# Patient Record
Sex: Female | Born: 1955 | Race: White | Hispanic: No | Marital: Married | Smoking: Never smoker
Health system: Southern US, Community
[De-identification: ages and names within clinical notes are randomized; demographics above are authoritative.]

## PROBLEM LIST (undated history)

## (undated) DIAGNOSIS — K603 Anal fistula, unspecified: Secondary | ICD-10-CM

## (undated) DIAGNOSIS — M8580 Other specified disorders of bone density and structure, unspecified site: Secondary | ICD-10-CM

## (undated) DIAGNOSIS — N390 Urinary tract infection, site not specified: Secondary | ICD-10-CM

## (undated) DIAGNOSIS — E785 Hyperlipidemia, unspecified: Secondary | ICD-10-CM

## (undated) DIAGNOSIS — K635 Polyp of colon: Secondary | ICD-10-CM

## (undated) HISTORY — DX: Other specified disorders of bone density and structure, unspecified site: M85.80

## (undated) HISTORY — DX: Polyp of colon: K63.5

## (undated) HISTORY — DX: Anal fistula: K60.3

## (undated) HISTORY — DX: Urinary tract infection, site not specified: N39.0

## (undated) HISTORY — PX: COLONOSCOPY: SHX174

## (undated) HISTORY — DX: Anal fistula, unspecified: K60.30

## (undated) HISTORY — DX: Hyperlipidemia, unspecified: E78.5

## (undated) HISTORY — PX: TUBAL LIGATION: SHX77

## (undated) HISTORY — PX: TREATMENT FISTULA ANAL: SUR1390

---

## 1999-08-18 ENCOUNTER — Other Ambulatory Visit: Admission: RE | Admit: 1999-08-18 | Discharge: 1999-08-18 | Payer: Self-pay | Admitting: Obstetrics & Gynecology

## 2000-10-31 ENCOUNTER — Other Ambulatory Visit: Admission: RE | Admit: 2000-10-31 | Discharge: 2000-10-31 | Payer: Self-pay | Admitting: Obstetrics & Gynecology

## 2002-07-02 ENCOUNTER — Other Ambulatory Visit: Admission: RE | Admit: 2002-07-02 | Discharge: 2002-07-02 | Payer: Self-pay | Admitting: Obstetrics & Gynecology

## 2005-06-09 ENCOUNTER — Other Ambulatory Visit: Admission: RE | Admit: 2005-06-09 | Discharge: 2005-06-09 | Payer: Self-pay | Admitting: Obstetrics & Gynecology

## 2006-07-06 ENCOUNTER — Ambulatory Visit: Payer: Self-pay | Admitting: Internal Medicine

## 2006-07-20 ENCOUNTER — Ambulatory Visit: Payer: Self-pay | Admitting: Internal Medicine

## 2012-05-30 ENCOUNTER — Ambulatory Visit (INDEPENDENT_AMBULATORY_CARE_PROVIDER_SITE_OTHER): Payer: 59 | Admitting: Sports Medicine

## 2012-05-30 ENCOUNTER — Encounter: Payer: Self-pay | Admitting: Sports Medicine

## 2012-05-30 VITALS — BP 157/87 | HR 55 | Ht 67.0 in | Wt 170.0 lb

## 2012-05-30 DIAGNOSIS — M25569 Pain in unspecified knee: Secondary | ICD-10-CM | POA: Insufficient documentation

## 2012-05-30 DIAGNOSIS — S76319A Strain of muscle, fascia and tendon of the posterior muscle group at thigh level, unspecified thigh, initial encounter: Secondary | ICD-10-CM

## 2012-05-30 DIAGNOSIS — IMO0002 Reserved for concepts with insufficient information to code with codable children: Secondary | ICD-10-CM

## 2012-05-30 MED ORDER — MELOXICAM 15 MG PO TABS: 15.0000 mg | ORAL_TABLET | Freq: Every day | ORAL | Status: AC

## 2012-05-30 NOTE — Progress Notes (Signed)
  Subjective:    Patient ID: Jacqueline Flynn, female    DOB: March 24, 1956, 56 y.o.   MRN: [id number]  HPI chief complaint: Right knee pain  Very pleasant 56 year old female comes in today complaining of 3 weeks of right knee pain. She is an avid walker walking about 10 miles a week but back in April she started a running program. She is now running 9-10 miles per week. She is complaining of pain in the posterior aspect of her knee which is worse after running. Very little discomfort while running. She describes it as aching in quality. No swelling. No mechanical symptoms. No feelings of instability. It is especially noticeable when first walking after sitting for period of time. No problems with his knee in the past. No prior knee surgeries. No associated numbness or tingling. No recent trauma. She does state that she has had her current running shoes for about a year and she is wondering whether or not she should get new shoes. She takes 400-600 mg of over-the-counter ibuprofen as needed and does find that helpful.  Medications include turmeric and calcium No prior surgeries No known drug allergies Socially she doesn't smoke, denies alcohol use, and works for [employer]    Review of Systems     Objective:   Physical Exam Well-developed, well-nourished. No acute distress. Awake alert and oriented x3  Right knee: Full range of motion without effusion. She is tender to palpation along the medial distal hamstring tendons at the popliteal fossa. No tenderness over the pes anserine bursa. No palpable Baker's cyst. Slight tenderness to palpation along the medial joint line posteriorly but negative McMurray's. No tenderness along the lateral joint line. Knee is stable to valgus and varus stressing. Negative anterior drawer, negative posterior drawer. Mild patellofemoral crepitus with active extension but no pain with patellar compression. Neurovascularly intact distally.  Examination  of her feet in the standing position shows a rather neutral foot. Evaluation of her gait does show some collapse of the midfoot on the right with walking. No obvious limp.       Assessment & Plan:  1. Right knee pain secondary to distal hamstring tendinitis  Mobic 15 mg daily with food for 7 days. Body helix knee sleeve to wear with activity. Green sports insoles to help provide a little bit of an arch. I also think the patient should buy new running shoes. She has well over 500 miles on her current pair. Followup in 3 weeks. She can continue with activity as tolerated in the interim.

## 2012-06-19 ENCOUNTER — Ambulatory Visit (INDEPENDENT_AMBULATORY_CARE_PROVIDER_SITE_OTHER): Payer: 59 | Admitting: Sports Medicine

## 2012-06-19 VITALS — BP 110/64 | Ht 67.0 in | Wt 165.0 lb

## 2012-06-19 DIAGNOSIS — M25569 Pain in unspecified knee: Secondary | ICD-10-CM

## 2012-06-19 NOTE — Assessment & Plan Note (Signed)
Significantly improved following Mobic and icing. Will advance to eccentric strengthening exercises now that acute reaction has improved.  Given eccentric hamstring strengthening exercises and hip Abductor exercises Continue with sports insoles. Continue mobic prn with regular post exercises icing Follow up prn

## 2012-06-19 NOTE — Progress Notes (Signed)
  [facility]  Patient name: Jacqueline Flynn MRN [id number]  Date of birth: 10-28-1955  CC & HPI:  Jacqueline Flynn is a 56 y.o. female presenting today for follow up of R posterior knee pain dx as semitendinous tendonitis.  She reports significant improvement in pain with complete resolution of her pain following 7 day course of Mobic.  Reports mild return of symptoms following tapering of mobic and is now having 1/10 pain especially when walking up steps.  ROS:  No knee clicking/locking, no falls  Pertinent History Reviewed:  Social History: Reviewed - Significant for nonsmoker  Objective Findings:  Vitals:  Filed Vitals:   06/19/12 1158  BP: 110/64    PE: GENERAL:  Adult caucasian female. In no discomfort; no respiratory distress. KNEE: R knee, TTP over insertion of semitendinosus.  Full ROM, no joint effusion, no midline tenderness, no tenderness over pes bursa. HIP STRENGTH: Abduction = 4/5 B Knee Flexion strength: 4/5 @ 90 & 60o bilateral with 4-/5 on L @ 90    Assessment & Plan:

## 2012-06-20 ENCOUNTER — Ambulatory Visit: Payer: 59 | Admitting: Sports Medicine

## 2014-10-08 ENCOUNTER — Ambulatory Visit: Payer: 59 | Admitting: Sports Medicine

## 2014-10-13 ENCOUNTER — Ambulatory Visit (INDEPENDENT_AMBULATORY_CARE_PROVIDER_SITE_OTHER): Payer: 59 | Admitting: Sports Medicine

## 2014-10-13 ENCOUNTER — Encounter: Payer: Self-pay | Admitting: Sports Medicine

## 2014-10-13 VITALS — BP 117/62 | Ht 66.0 in | Wt 160.0 lb

## 2014-10-13 DIAGNOSIS — M25551 Pain in right hip: Secondary | ICD-10-CM

## 2014-10-13 DIAGNOSIS — M25561 Pain in right knee: Secondary | ICD-10-CM

## 2014-10-13 MED ORDER — MELOXICAM 15 MG PO TABS: ORAL_TABLET | ORAL | Status: DC

## 2014-10-13 NOTE — Progress Notes (Signed)
   Subjective:    Patient ID: Jacqueline Flynn, female    DOB: 1956/05/05, 59 y.o.   MRN: [id number]  HPI chief complaint: Right hip and right leg pain  Very pleasant 59 year old female comes in today complaining of several weeks of right hip and leg pain. She is an avid Education officer, environmental. She began to experience some pain in the proximal hamstring area without any known injury. Pain at times will radiate past the knee into the lateral lower leg. She was also getting some pain along the lateral foot but that has resolved. Pain is now primarily in the proximal hamstring area. Present mainly with lumbar flexion. Otherwise, minimal pain. No pain with sitting. No deep-seated groin pain. No associated numbness or tingling. She was seen in the office in 2013 and diagnosed with distal hamstring tendinitis. She was given a body helix sleeve, green sports insoles, a home exercise program, and a seven-day course of meloxicam. Her symptoms resolved with this treatment.    Review of Systems     Objective:   Physical Exam Well-developed, well-nourished. No acute distress. Sitting comfortably in exam room.  Right hip: Smooth painless hip range of motion with a negative logroll. There is tenderness to palpation at the origin of the hamstring tendon. No tenderness over the greater trochanteric bursa.  Neurological exam: Negative straight leg raise on the left. Straight leg raise on the right reproduces posterior right hip pain that no true radiculopathy. Reflexes are equal at the Achilles and patellar tendons bilaterally. Strength is 5/5 both lower extremities. Sensation is grossly intact to light-touch.       Assessment & Plan:  Right hip pain likely secondary to proximal hamstring tendinopathy  [place] home exercise program. Meloxicam 15 mg daily for 7 days then when necessary. I provided her with a new body helix knee sleeve (she still uses the old one with exercise) and a new pair of green sports  insoles. Although I think this is likely a proximal hamstring tendinopathy the possibility of lumbar radiculopathy exists and if symptoms persist or worsen despite today's treatment patient will return to the office for reevaluation and further workup. Otherwise, continue with activity as tolerated and follow-up when necessary.

## 2015-06-08 ENCOUNTER — Encounter: Payer: Self-pay | Admitting: Sports Medicine

## 2015-06-08 ENCOUNTER — Ambulatory Visit (INDEPENDENT_AMBULATORY_CARE_PROVIDER_SITE_OTHER): Payer: 59 | Admitting: Sports Medicine

## 2015-06-08 VITALS — BP 124/77 | HR 74 | Ht 67.0 in | Wt 153.0 lb

## 2015-06-08 DIAGNOSIS — M25572 Pain in left ankle and joints of left foot: Secondary | ICD-10-CM | POA: Diagnosis not present

## 2015-06-08 NOTE — Progress Notes (Signed)
Patient ID: Jacqueline Flynn, female   DOB: December 20, 1955, 59 y.o.   MRN: [id number]  CC: Left foot pain  HPI:  59 year old runner/walker c/o left foot pain that began around 3 months ago.  States that a few weeks before that she got some new shoes ([employer]).  The pain is on the medial aspect of the left MTP joint.  She also has some swelling of that area. Worse when she runs or walks (runs about 9 miles/week, walked 2.5 miles 4-5x weekly) Notes that last week it was more swollen and red; she rested for 3 days and it has improved. Has not tried ice, NSAIDS, or anything else.  Notes that she has been wearing her formerly prescribed green inserts.   EXAM:  BP 124/77 mmHg  Pulse 74  Ht 5\' 7"  (1.702 m)  Wt 153 lb (69.4 kg)  BMI 23.96 kg/m2 Well-developed, well-nourished. No acute distress. Sitting comfortably in exam room. Breathing comfortably, appears well perfused with warm pink skin.   Foot: Left foot with prominence over medial MTP joint. TTP over this area; no ttp over any other area on foot or ankle. No redness or warmth. Minimal lateral angulation of great toe.  Pain with movement of great toe. Normal ROM of ankle and foot; normal strength and sensation.   [place]: Hypoechoic area extending from left great toe MTP, thickened bursa visualized as well.   A/P:  60 year old runner/walker c/o left foot pain that began about 3 months ago, around the time she got some new shoes. On exam and [place], it appears that she has an inflamed bursa in this area, as well as fluid extending from the MTP joint that would suggest inflammation of this joint. Her shoes appear to have a narrow toe box, which could be contributing to this.  Plan:  Recommended getting new shoes with wider toe box to reduce friction over the bursae and compression of this joint. Placed first ray post on her existing green inserts to help stabilize the joint.  Recommend aspercream and ice TID to reduce inflammation.  Continue activity  as tolerated. Gave return precautions for worsening symptoms.   Waynard Reeds, MD This pt seen and discussed with Shellia Cleverly  Patient seen and evaluated with the above-named resident. I agree with the plan of care. Hopefully the addition of a first ray post to her green sports insole in a wider toe box will help reduce her symptoms. Follow-up if symptoms worsen.

## 2015-10-06 ENCOUNTER — Ambulatory Visit: Payer: 59 | Admitting: Podiatry

## 2015-10-13 ENCOUNTER — Ambulatory Visit: Payer: 59 | Admitting: Podiatry

## 2015-10-27 ENCOUNTER — Ambulatory Visit (INDEPENDENT_AMBULATORY_CARE_PROVIDER_SITE_OTHER): Payer: 59 | Admitting: Podiatry

## 2015-10-27 ENCOUNTER — Encounter: Payer: Self-pay | Admitting: Podiatry

## 2015-10-27 ENCOUNTER — Ambulatory Visit (INDEPENDENT_AMBULATORY_CARE_PROVIDER_SITE_OTHER): Payer: 59

## 2015-10-27 VITALS — BP 126/77 | HR 60 | Resp 12

## 2015-10-27 DIAGNOSIS — M779 Enthesopathy, unspecified: Secondary | ICD-10-CM | POA: Diagnosis not present

## 2015-10-27 DIAGNOSIS — M2012 Hallux valgus (acquired), left foot: Secondary | ICD-10-CM | POA: Diagnosis not present

## 2015-10-27 NOTE — Progress Notes (Signed)
   Subjective:    Patient ID: Jacqueline Flynn, female    DOB: [birth year], 60 y.o.   MRN: [id number]  HPI: She presents today with a chief complaint of a painful bunion left foot 10 months. She states that she is an avid runner and has been reduced to walking at this point. She piercings that bother her foot bending the toe is painful particularly on end range. She states that the ball seems to cause pain also particularly with 1 particular pair of sandals.    Review of Systems  All other systems reviewed and are negative.      Objective:   Physical Exam: Vital signs are stable she is alert and oriented 3. Pulses are strongly palpable. Neurologic sensorium is intact. Deep tendon reflexes are intact. Muscle strength +5 over 5 dorsiflexion plantar flexors and inverters everters all intrinsic musculature is intact. Orthopedic evaluation demonstrates hallux abductovalgus deformity of the left foot than minimal she does have pain on end range of motion of the first metatarsophalangeal joint of the left foot which is significantly less dorsiflexion than that of the right foot. Radiographic evaluation today does demonstrate an elongated elevated first metatarsal of the left foot resulting in hallux limitus of the first metatarsophalangeal joint early dorsal spurring is noted and early joint space narrowing. 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 Cutaneous evaluation demonstrates supple well-hydrated cutis no erythema or edema cellulitis drainage or odor.        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  Plan: Discussed etiology pathology conservative versus surgical therapies. After thorough discussion of surgical and nonsurgical and other conservative means we decided to inject with dexamethasone first metatarsophalangeal joint of the left foot after sterile Betadine skin prep. 2 mg was  injected. I will follow-up with her on an as-needed basis for evaluation. We did discuss the possible need for long-term oral therapy orthotics and/or surgery.

## 2015-12-16 ENCOUNTER — Other Ambulatory Visit: Payer: Self-pay | Admitting: Obstetrics & Gynecology

## 2015-12-16 DIAGNOSIS — R928 Other abnormal and inconclusive findings on diagnostic imaging of breast: Secondary | ICD-10-CM

## 2015-12-23 ENCOUNTER — Ambulatory Visit
Admission: RE | Admit: 2015-12-23 | Discharge: 2015-12-23 | Disposition: A | Discharge status: Home / Self Care | Payer: 59 | Source: Ambulatory Visit | Attending: Obstetrics & Gynecology | Admitting: Obstetrics & Gynecology

## 2015-12-23 ENCOUNTER — Other Ambulatory Visit: Payer: 59

## 2015-12-23 DIAGNOSIS — R928 Other abnormal and inconclusive findings on diagnostic imaging of breast: Secondary | ICD-10-CM

## 2016-05-16 ENCOUNTER — Encounter: Payer: Self-pay | Admitting: Gastroenterology

## 2016-05-30 ENCOUNTER — Encounter: Payer: Self-pay | Admitting: Gastroenterology

## 2016-06-21 ENCOUNTER — Other Ambulatory Visit: Payer: Self-pay | Admitting: Emergency Medicine

## 2016-06-21 ENCOUNTER — Ambulatory Visit
Admission: RE | Admit: 2016-06-21 | Discharge: 2016-06-21 | Disposition: A | Discharge status: Home / Self Care | Payer: No Typology Code available for payment source | Source: Ambulatory Visit | Attending: Emergency Medicine | Admitting: Emergency Medicine

## 2016-06-21 DIAGNOSIS — T1490XA Injury, unspecified, initial encounter: Secondary | ICD-10-CM

## 2016-07-18 ENCOUNTER — Ambulatory Visit (AMBULATORY_SURGERY_CENTER): Payer: 59 | Admitting: *Deleted

## 2016-07-18 VITALS — Ht 67.0 in | Wt 165.0 lb

## 2016-07-18 DIAGNOSIS — Z1211 Encounter for screening for malignant neoplasm of colon: Secondary | ICD-10-CM

## 2016-07-18 MED ORDER — NA SULFATE-K SULFATE-MG SULF 17.5-3.13-1.6 GM/177ML PO SOLN: 1.0000 | Freq: Once | ORAL | 0 refills | Status: AC

## 2016-07-18 NOTE — Progress Notes (Signed)
No egg or soy allergy. No anesthesia problems.  No home O2.  No diet meds.  

## 2016-08-02 ENCOUNTER — Encounter: Payer: 59 | Admitting: Gastroenterology

## 2016-08-05 ENCOUNTER — Encounter: Payer: Self-pay | Admitting: Gastroenterology

## 2016-08-16 ENCOUNTER — Encounter: Payer: Self-pay | Admitting: Gastroenterology

## 2016-08-16 ENCOUNTER — Ambulatory Visit (AMBULATORY_SURGERY_CENTER): Payer: 59 | Admitting: Gastroenterology

## 2016-08-16 VITALS — BP 136/72 | HR 68 | Temp 98.4°F | Resp 15 | Ht 67.0 in | Wt 165.0 lb

## 2016-08-16 DIAGNOSIS — Z1211 Encounter for screening for malignant neoplasm of colon: Secondary | ICD-10-CM

## 2016-08-16 DIAGNOSIS — Z1212 Encounter for screening for malignant neoplasm of rectum: Secondary | ICD-10-CM | POA: Diagnosis not present

## 2016-08-16 DIAGNOSIS — D122 Benign neoplasm of ascending colon: Secondary | ICD-10-CM

## 2016-08-16 MED ORDER — SODIUM CHLORIDE 0.9 % IV SOLN: 500.0000 mL | INTRAVENOUS | Status: DC

## 2016-08-16 NOTE — Patient Instructions (Signed)
YOU HAD AN ENDOSCOPIC PROCEDURE TODAY AT [facility]:   Refer to the procedure report that was given to you for any specific questions about what was found during the examination.  If the procedure report does not answer your questions, please call your gastroenterologist to clarify.  If you requested that your care partner not be given the details of your procedure findings, then the procedure report has been included in a sealed envelope for you to review at your convenience later.  YOU SHOULD EXPECT: Some feelings of bloating in the abdomen. Passage of more gas than usual.  Walking can help get rid of the air that was put into your GI tract during the procedure and reduce the bloating. If you had a lower endoscopy (such as a colonoscopy or flexible sigmoidoscopy) you may notice spotting of blood in your stool or on the toilet paper. If you underwent a bowel prep for your procedure, you may not have a normal bowel movement for a few days.  Please Note:  You might notice some irritation and congestion in your nose or some drainage.  This is from the oxygen used during your procedure.  There is no need for concern and it should clear up in a day or so.  SYMPTOMS TO REPORT IMMEDIATELY:   Following lower endoscopy (colonoscopy or flexible sigmoidoscopy):  Excessive amounts of blood in the stool  Significant tenderness or worsening of abdominal pains  Swelling of the abdomen that is new, acute  Fever of 100F or higher   For urgent or emergent issues, a gastroenterologist can be reached at any hour by calling [phone].   DIET:  We do recommend a small meal at first, but then you may proceed to your regular diet.  Drink plenty of fluids but you should avoid alcoholic beverages for 24 hours.  ACTIVITY:  You should plan to take it easy for the rest of today and you should NOT DRIVE or use heavy machinery until tomorrow (because of the sedation medicines used during the test).     FOLLOW UP: Our staff will call the number listed on your records the next business day following your procedure to check on you and address any questions or concerns that you may have regarding the information given to you following your procedure. If we do not reach you, we will leave a message.  However, if you are feeling well and you are not experiencing any problems, there is no need to return our call.  We will assume that you have returned to your regular daily activities without incident.  If any biopsies were taken you will be contacted by phone or by letter within the next 1-3 weeks.  Please call us at [phone] if you have not heard about the biopsies in 3 weeks.    SIGNATURES/CONFIDENTIALITY: You and/or your care partner have signed paperwork which will be entered into your electronic medical record.  These signatures attest to the fact that that the information above on your After Visit Summary has been reviewed and is understood.  Full responsibility of the confidentiality of this discharge information lies with you and/or your care-partner.  Polyp, hemorrhoids-handouts given  Repeat colonoscopy will be determined by pathology.

## 2016-08-16 NOTE — Progress Notes (Signed)
Report given to PACU RN, vss 

## 2016-08-16 NOTE — Progress Notes (Signed)
Called to room to assist during endoscopic procedure.  Patient ID and intended procedure confirmed with present staff. Received instructions for my participation in the procedure from the performing physician.  

## 2016-08-16 NOTE — Op Note (Signed)
[facility] Patient Name: Jacqueline Flynn Procedure Date: 08/16/2016 2:05 PM MRN: [id number] Endoscopist: Mauri Pole , MD Age: 60 Referring MD:  Date of Birth: 06/11/1956 Gender: Female Account #: [id number] Procedure:                Colonoscopy Indications:              Screening for colorectal malignant neoplasm, Last                            colonoscopy: 2007 Medicines:                Monitored Anesthesia Care Procedure:                Pre-Anesthesia Assessment:                           - Prior to the procedure, a History and Physical                            was performed, and patient medications and                            allergies were reviewed. The patient's tolerance of                            previous anesthesia was also reviewed. The risks                            and benefits of the procedure and the sedation                            options and risks were discussed with the patient.                            All questions were answered, and informed consent                            was obtained. Prior Anticoagulants: The patient has                            taken no previous anticoagulant or antiplatelet                            agents. ASA Grade Assessment: II - A patient with                            mild systemic disease. After reviewing the risks                            and benefits, the patient was deemed in                            satisfactory condition to undergo the procedure.  After obtaining informed consent, the colonoscope                            was passed under direct vision. Throughout the                            procedure, the patient's blood pressure, pulse, and                            oxygen saturations were monitored continuously. The                            Model CF-HQ190L [id number]) scope was introduced                            through the anus and advanced to  the the cecum,                            identified by appendiceal orifice and ileocecal                            valve. The colonoscopy was performed without                            difficulty. The patient tolerated the procedure                            well. The quality of the bowel preparation was                            excellent. The ileocecal valve, appendiceal                            orifice, and rectum were photographed. Scope In: 2:25:11 PM Scope Out: 2:49:24 PM Scope Withdrawal Time: 0 hours 17 minutes 22 seconds  Total Procedure Duration: 0 hours 24 minutes 13 seconds  Findings:                 The perianal and digital rectal examinations were                            normal.                           A 8 mm polyp was found in the ascending colon. The                            polyp was sessile. The polyp was removed with a                            cold snare. Resection and retrieval were complete.                           Non-bleeding internal hemorrhoids were found during  retroflexion. The hemorrhoids were small.                           The exam was otherwise without abnormality. Complications:            No immediate complications. Estimated Blood Loss:     Estimated blood loss was minimal. Impression:               - One 8 mm polyp in the ascending colon, removed                            with a cold snare. Resected and retrieved.                           - Non-bleeding internal hemorrhoids.                           - The examination was otherwise normal. Recommendation:           - Patient has a contact number available for                            emergencies. The signs and symptoms of potential                            delayed complications were discussed with the                            patient. Return to normal activities tomorrow.                            Written discharge instructions were provided to the                             patient.                           - Resume previous diet.                           - Continue present medications.                           - Await pathology results.                           - Repeat colonoscopy in 5-10 years for surveillance                            based on pathology results. Mauri Pole, MD 08/16/2016 2:52:38 PM This report has been signed electronically.

## 2016-08-17 ENCOUNTER — Telehealth: Payer: Self-pay | Admitting: *Deleted

## 2016-08-17 NOTE — Telephone Encounter (Signed)
  Follow up Call-  Call back number 08/16/2016  Post procedure Call Back phone  # [phone]  Permission to leave phone message Yes  Some recent data might be hidden     Patient questions:  Do you have a fever, pain , or abdominal swelling? No. Pain Score  0 *  Have you tolerated food without any problems? Yes.    Have you been able to return to your normal activities? Yes.    Do you have any questions about your discharge instructions: Diet   No. Medications  No. Follow up visit  No.  Do you have questions or concerns about your Care? No.  Actions: * If pain score is 4 or above: No action needed, pain <4.

## 2016-08-24 ENCOUNTER — Encounter: Payer: Self-pay | Admitting: Gastroenterology

## 2017-01-18 IMAGING — CR DG FOREARM 2V*R*
2 series · 2 of 2 positions shown · non-contrast
Comparison: None.

CLINICAL DATA: Right forearm contusion 2 days ago

EXAM:
RIGHT FOREARM - 2 VIEW

[x forearm ap right]
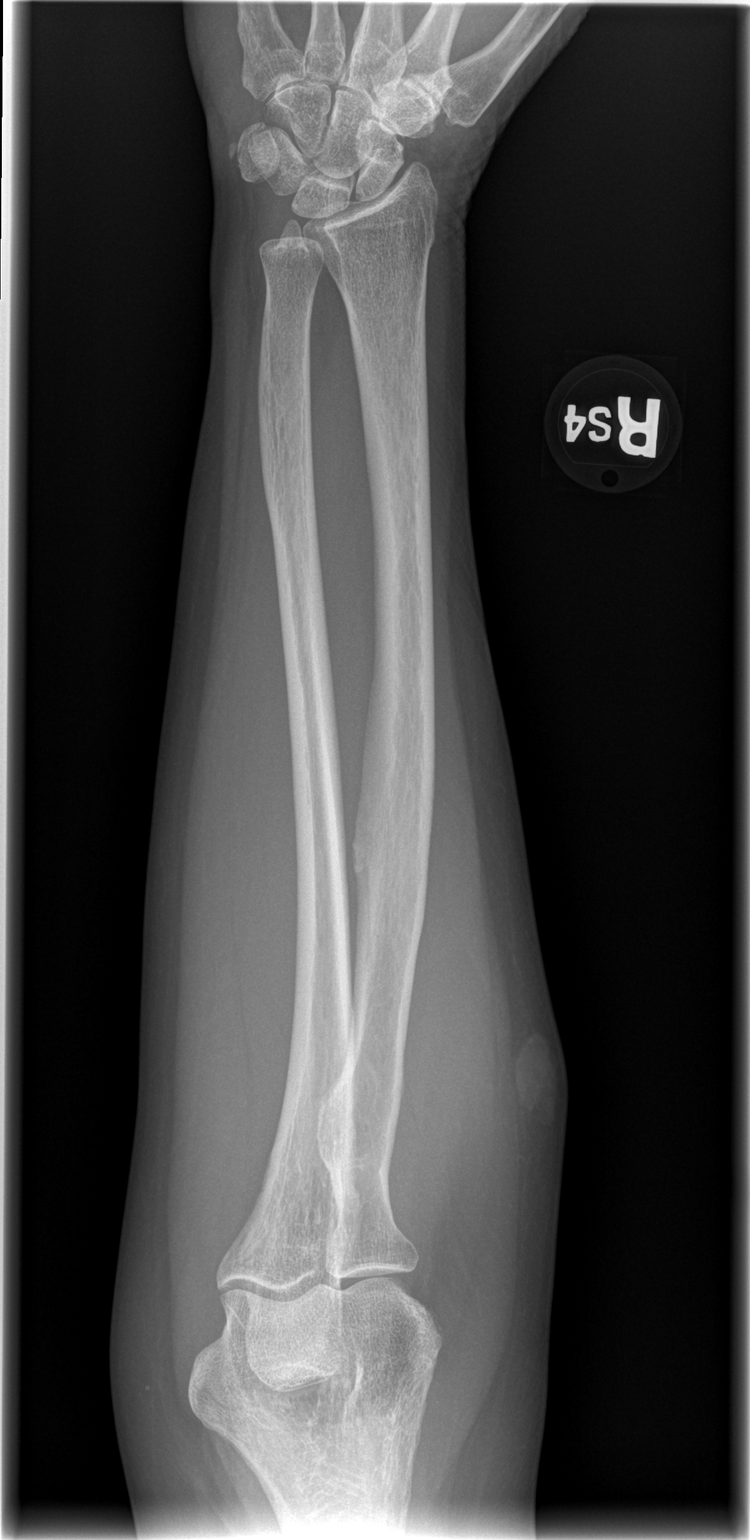

[x forearm lat right]
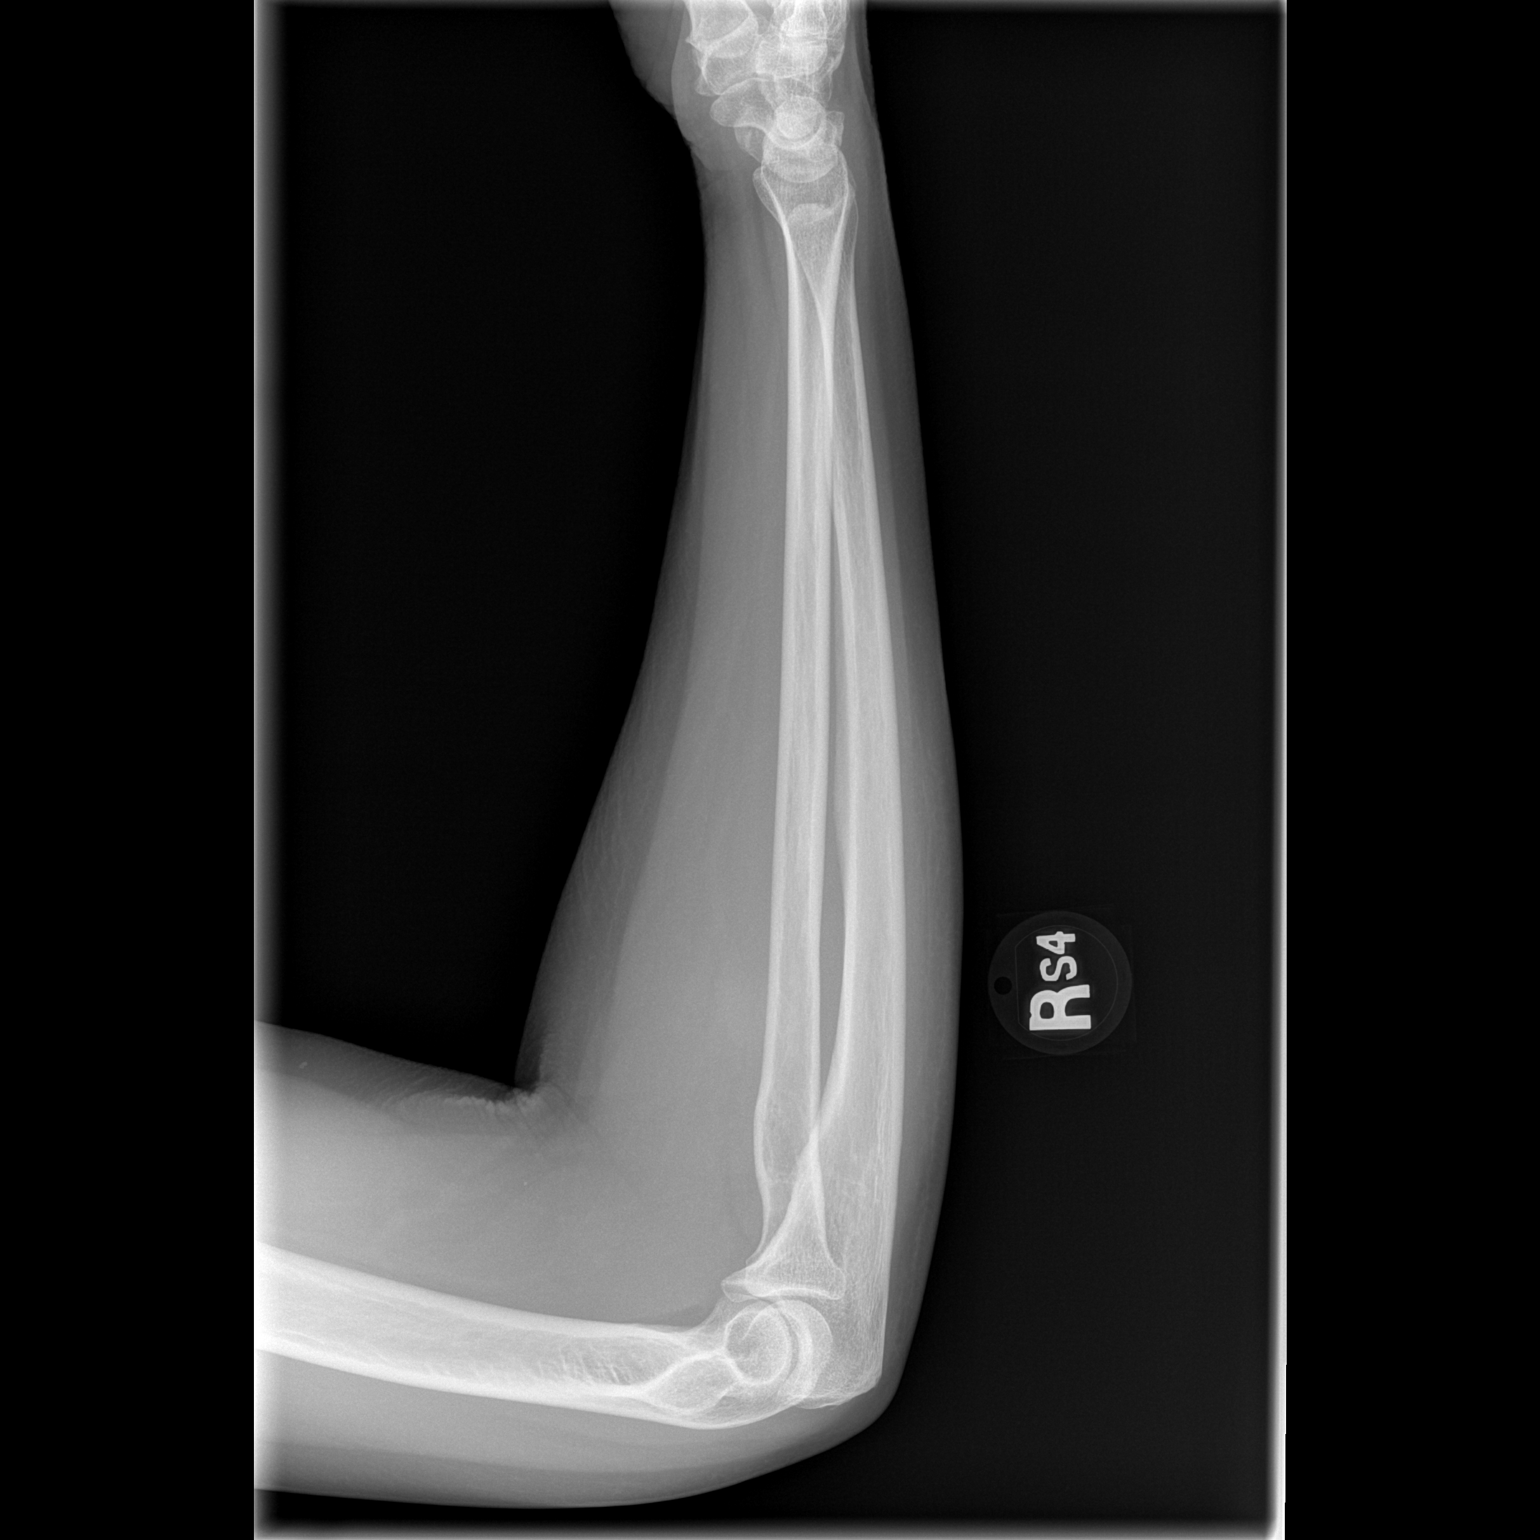

[2 of 2 positions shown; findings below may reference images not displayed]

FINDINGS: Two views of the right forearm submitted. No acute fracture or
subluxation. Soft tissue swelling noted proximal forearm.
IMPRESSION: No acute fracture or subluxation. Soft tissue swelling proximal
forearm.

## 2018-02-23 ENCOUNTER — Other Ambulatory Visit: Payer: Self-pay | Admitting: Internal Medicine

## 2018-02-23 DIAGNOSIS — E785 Hyperlipidemia, unspecified: Secondary | ICD-10-CM

## 2018-03-20 ENCOUNTER — Ambulatory Visit
Admission: RE | Admit: 2018-03-20 | Discharge: 2018-03-20 | Disposition: A | Discharge status: Home / Self Care | Payer: No Typology Code available for payment source | Source: Ambulatory Visit | Attending: Internal Medicine | Admitting: Internal Medicine

## 2018-03-20 DIAGNOSIS — E785 Hyperlipidemia, unspecified: Secondary | ICD-10-CM

## 2019-10-17 ENCOUNTER — Other Ambulatory Visit: Payer: Self-pay

## 2019-10-17 ENCOUNTER — Ambulatory Visit: Payer: BC Managed Care – PPO | Admitting: Sports Medicine

## 2019-10-17 ENCOUNTER — Ambulatory Visit
Admission: RE | Admit: 2019-10-17 | Discharge: 2019-10-17 | Disposition: A | Discharge status: Home / Self Care | Payer: BC Managed Care – PPO | Source: Ambulatory Visit | Attending: Sports Medicine | Admitting: Sports Medicine

## 2019-10-17 VITALS — BP 128/70 | Ht 67.0 in | Wt 156.0 lb

## 2019-10-17 DIAGNOSIS — R29898 Other symptoms and signs involving the musculoskeletal system: Secondary | ICD-10-CM | POA: Diagnosis not present

## 2019-10-17 DIAGNOSIS — M25561 Pain in right knee: Secondary | ICD-10-CM

## 2019-10-17 NOTE — Progress Notes (Addendum)
HPI  CC: Right hip and knee pain.  Right knee pain.:  Patient has had issues with right leg pain for multiple years and has previously seen the sports medicine clinic for issues of hamstring weakness.  Her pain had resolved until a few months ago when it started to return.  She is an avid runner, running 3 miles 3 times a week and walking 3 miles 2 other days a week.  The pain usually starts after the run, not during the run.  Specifically, after she has been sitting for a while and then gets back up.  It is a medial knee pain.  Does not extend to the shin or thigh.  No neuropathic pain, no numbness.  Describes some audible popping, but no pain with popping sensation.  No buckling of the knee.  She does state that sometimes she notices the right knee is more swollen than the left.  She has taken ibuprofen once or twice.  Has not tried anything else to improve the pain.  Right gluteal soreness: She cannot state whether this occurred before or after the right knee pain started worsening.  She is not describe it as a pain, but more of a soreness.  Also occurs after she has run.  Patient has not have any known heart or kidney issues, does not have high blood pressure.  Medications/Interventions Tried: Ibuprofen 400 mg  See HPI and/or previous note for associated ROS.  Objective: BP 128/70   Ht 5\' 7"  (1.702 m)   Wt 156 lb (70.8 kg)   BMI 24.43 kg/m  Gen:  NAD, well groomed, a/o x3, normal affect.  Appears stated age. CV: Well-perfused. Warm.  Resp: Non-labored.  Neuro: Sensation intact throughout. No gross coordination deficits. MSK: Lower extremities appear symmetrical bilaterally.  No obvious deformity seen.  Passive and active range of motion normal except for slight decrease in inward rotation of the right hip.  Slight right abductor muscle weakness.  5/5 strength otherwise in the lower extremities bilaterally.  No knee instability.  Thessaly test negative.  Trendelenburg positive. Gait:  Nonpathologic posture, unremarkable stride without signs of limp or balance issues.   Assessment and plan:  Weakness of right hip Patient complains of a soreness in her right gluteal area after running.  Trendelenburg positive on exam.  Soreness occurs after running, during.  On exam she had some right abductor muscle weakness.  Most likely gluteus medius.  Patient was given home exercises to do and advised to adjust her running schedule as needed due to the pain/weakness.  Knee pain Patient seen in the sports medicine office several years ago for right knee pain that was diagnosed as being due to hamstring weakness.  Was given exercises and then the pain improved.  Recently pain came back specifically after she runs.  Still is able to run 3 miles 3 times a week.  Tenderness palpation on the medial joint line of the knee.  Knee otherwise stable.  Negative for signs of meniscal injury.  Most likely represents arthritis of the knee. -Follow-up knee x-ray -Ibuprofen 40 mg once a day as needed. -If pain is worsening and interfering with her day-to-day functioning, patient should schedule a follow-up appointment before she leaves for [place] for possible steroid injection.  Otherwise follow-up after she returns from [place].   Orders Placed This Encounter  Procedures  . DG Knee AP/LAT W/Sunrise Right    Standing Status:   Future    Standing Expiration Date:   12/14/2020  Order Specific Question:   Reason for Exam (SYMPTOM  OR DIAGNOSIS REQUIRED)    Answer:   right knee pain; ap view to be standing please    Order Specific Question:   Preferred imaging location?    Answer:   [facility]    Order Specific Question:   Radiology Contrast Protocol - do NOT remove file path    Answer:   \\[email or web address]    No orders of the defined types were placed in this encounter.    Clemetine Marker, MD [facility] resident, PGY 2 10/17/2019 10:22  AM  Patient seen and evaluated with the resident.  I agree with the above plan of care.  For her posterior right hip pain, the patient was given both pelvic stabilizer exercises and hip abductor strengthening exercises.  Her right knee pain is likely secondary to mild osteoarthritis.  She may have a degenerative meniscal tear as well although she is able to continue to be fairly active.  We will get an x-ray of her knee and I will call her with those results when available.  I will give her isometric quad strengthening exercises and she will modify her activity based on symptoms.  If symptoms worsen then consider merits of cortisone injection.  Follow-up as needed.  Addendum: X-rays show only mild degenerative changes.  Nothing acute.

## 2019-10-17 NOTE — Patient Instructions (Signed)
It was nice to meet you today,  In regards to your hip, you have some abductor muscle weakness.  We have given you some exercises to do at home.  For your knee pain, it is most likely due to arthritis.  We will get an x-ray to assess the presence and extent of arthritis.  We will discuss the results with you when we get them.  I would like you to take 400 mg of ibuprofen once a day as needed for your pain.  If your pain becomes too excessive, you can decrease the amount of running you do.  I have also given you some knee strengthening exercises to do at home.  If your pain is much worse and interfering with your day-to-day functioning, you should make an appointment before you leave to [place] so we can give you a steroid injection if necessary.  Otherwise, we will follow up with you after your trip to [place].  Have a great day.

## 2019-10-17 NOTE — Assessment & Plan Note (Signed)
Patient complains of a soreness in her right gluteal area after running.  Trendelenburg positive on exam.  Soreness occurs after running, during.  On exam she had some right abductor muscle weakness.  Most likely gluteus medius.  Patient was given home exercises to do and advised to adjust her running schedule as needed due to the pain/weakness.

## 2019-10-17 NOTE — Assessment & Plan Note (Signed)
Patient seen in the sports medicine office several years ago for right knee pain that was diagnosed as being due to hamstring weakness.  Was given exercises and then the pain improved.  Recently pain came back specifically after she runs.  Still is able to run 3 miles 3 times a week.  Tenderness palpation on the medial joint line of the knee.  Knee otherwise stable.  Negative for signs of meniscal injury.  Most likely represents arthritis of the knee. -Follow-up knee x-ray -Ibuprofen 40 mg once a day as needed. -If pain is worsening and interfering with her day-to-day functioning, patient should schedule a follow-up appointment before she leaves for [place] for possible steroid injection.  Otherwise follow-up after she returns from [place].

## 2021-08-12 ENCOUNTER — Encounter: Payer: Self-pay | Admitting: Gastroenterology

## 2021-09-13 ENCOUNTER — Ambulatory Visit (INDEPENDENT_AMBULATORY_CARE_PROVIDER_SITE_OTHER): Payer: Medicare PPO | Admitting: Gastroenterology

## 2021-09-13 ENCOUNTER — Encounter: Payer: Self-pay | Admitting: Gastroenterology

## 2021-09-13 VITALS — BP 164/80 | HR 72 | Ht 66.0 in | Wt 166.5 lb

## 2021-09-13 DIAGNOSIS — Z86010 Personal history of colonic polyps: Secondary | ICD-10-CM | POA: Diagnosis not present

## 2021-09-13 MED ORDER — NA SULFATE-K SULFATE-MG SULF 17.5-3.13-1.6 GM/177ML PO SOLN: ORAL | 0 refills | Status: DC

## 2021-09-13 NOTE — Patient Instructions (Signed)
You have been scheduled for a colonoscopy. Please follow written instructions given to you at your visit today.  Please pick up your prep supplies at the pharmacy within the next 1-3 days. If you use inhalers (even only as needed), please bring them with you on the day of your procedure.   If you are age 65 or older, your body mass index should be between 23-30. Your Body mass index is 26.87 kg/m. If this is out of the aforementioned range listed, please consider follow up with your Primary Care Provider.  If you are age 63 or younger, your body mass index should be between 19-25. Your Body mass index is 26.87 kg/m. If this is out of the aformentioned range listed, please consider follow up with your Primary Care Provider.   ________________________________________________________  The [facility] providers would like to encourage you to use [facility] to communicate with providers for non-urgent requests or questions.  Due to long hold times on the telephone, sending your provider a message by [facility] may be a faster and more efficient way to get a response.  Please allow 48 business hours for a response.  Please remember that this is for non-urgent requests.  _______________________________________________________   Due to recent changes in healthcare laws, you may see the results of your imaging and laboratory studies on MyChart before your provider has had a chance to review them.  We understand that in some cases there may be results that are confusing or concerning to you. Not all laboratory results come back in the same time frame and the provider may be waiting for multiple results in order to interpret others.  Please give [place] 48 hours in order for your provider to thoroughly review all the results before contacting the office for clarification of your results.    Thank you for choosing [facility]  Karleen Hampshire Nandigam,MD

## 2021-09-13 NOTE — Progress Notes (Signed)
Jacqueline Flynn    [id number]    25-Dec-1955  Primary Care Physician:Russo, Jenny Reichmann, MD  Referring Physician: Shon Baton, MD [address] [place],  [place] [address]   Chief complaint: History of colon polyps  HPI:  65 year old very pleasant female with history of colon polyps here to discuss recall surveillance colonoscopy Denies any nausea, vomiting, abdominal pain, melena or bright red blood per rectum   Colonoscopy 08/16/2016 - One 8 mm polyp in the ascending colon, removed with a cold snare. Resected and retrieved. - Non-bleeding internal hemorrhoids. - The examination was otherwise normal. 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  Outpatient Encounter Medications as of 09/13/2021  Medication Sig   Black Pepper-Turmeric (TURMERIC CURCUMIN) 02-999 MG CAPS Take by mouth.   Calcium Carbonate-Vitamin D (CALCIUM + D PO) Take 1 tablet by mouth daily.   cholecalciferol (VITAMIN D) 1000 units tablet Take 1,000 Units by mouth daily.   Fish Oil OIL by Does not apply route.   No facility-administered encounter medications on file as of 09/13/2021.    Allergies as of 09/13/2021   (No Known Allergies)    Past Medical History:  Diagnosis Date   Anal fistula    Osteopenia     Past Surgical History:  Procedure Laterality Date   CESAREAN SECTION     COLONOSCOPY     TREATMENT FISTULA ANAL     repair   TUBAL LIGATION      Family History  Problem Relation Age of Onset   Colon cancer Neg Hx     Social History   Socioeconomic History   Marital status: Married    Spouse name: Not on file   Number of children: Not on file   Years of education: Not on file   Highest education level: Not on file  Occupational History   Not on file  Tobacco Use   Smoking status: Never   Smokeless tobacco: Never  Substance and Sexual Activity   Alcohol use: Yes    Alcohol/week: 7.0 standard drinks     Types: 7 Glasses of wine per week   Drug use: No   Sexual activity: Not on file  Other Topics Concern   Not on file  Social History Narrative   Not on file   Social Determinants of Health   Financial Resource Strain: Not on file  Food Insecurity: Not on file  Transportation Needs: Not on file  Physical Activity: Not on file  Stress: Not on file  Social Connections: Not on file  Intimate Partner Violence: Not on file      Review of systems: All other review of systems negative except as mentioned in the HPI.   Physical Exam: Vitals:   09/13/21 0851  BP: (!) 164/80  Pulse: 72   Body mass index is 26.87 kg/m. Gen:      No acute distress HEENT:  sclera anicteric Abd:      soft, non-tender; no palpable masses, no distension Ext:    No edema Neuro: alert and oriented x 3 Psych: normal mood and affect  Data Reviewed:  Reviewed labs, radiology imaging, old records and pertinent past GI work up   Assessment and Plan/Recommendations:  65 year old very pleasant female with history of sessile serrated polyp, is due for surveillance colonoscopy.  We will schedule it today during office visit. The risks and benefits as well as alternatives of endoscopic procedure(s) have been discussed  and reviewed. All questions answered. The patient agrees to proceed.  Return as needed after colonoscopy  The patient was provided an opportunity to ask questions and all were answered. The patient agreed with the plan and demonstrated an understanding of the instructions.  Damaris Hippo , MD    CC: Shon Baton, MD

## 2021-09-21 DIAGNOSIS — L821 Other seborrheic keratosis: Secondary | ICD-10-CM | POA: Diagnosis not present

## 2021-09-21 DIAGNOSIS — D1801 Hemangioma of skin and subcutaneous tissue: Secondary | ICD-10-CM | POA: Diagnosis not present

## 2021-09-21 DIAGNOSIS — L738 Other specified follicular disorders: Secondary | ICD-10-CM | POA: Diagnosis not present

## 2021-09-21 DIAGNOSIS — D2262 Melanocytic nevi of left upper limb, including shoulder: Secondary | ICD-10-CM | POA: Diagnosis not present

## 2021-09-21 DIAGNOSIS — L814 Other melanin hyperpigmentation: Secondary | ICD-10-CM | POA: Diagnosis not present

## 2021-09-21 DIAGNOSIS — D2271 Melanocytic nevi of right lower limb, including hip: Secondary | ICD-10-CM | POA: Diagnosis not present

## 2021-09-21 DIAGNOSIS — D225 Melanocytic nevi of trunk: Secondary | ICD-10-CM | POA: Diagnosis not present

## 2021-09-21 DIAGNOSIS — D2239 Melanocytic nevi of other parts of face: Secondary | ICD-10-CM | POA: Diagnosis not present

## 2021-09-23 DIAGNOSIS — H2513 Age-related nuclear cataract, bilateral: Secondary | ICD-10-CM | POA: Diagnosis not present

## 2021-09-23 DIAGNOSIS — H43813 Vitreous degeneration, bilateral: Secondary | ICD-10-CM | POA: Diagnosis not present

## 2021-09-26 ENCOUNTER — Encounter: Payer: Self-pay | Admitting: Gastroenterology

## 2021-11-01 ENCOUNTER — Telehealth: Payer: Self-pay | Admitting: Gastroenterology

## 2021-11-01 NOTE — Telephone Encounter (Signed)
Inbound call from patient stating she was prescribed nitrofurantoin mono for uti.  Per medication's instructions, should take with food and patient is wanting to know if ok to take on day of procedure.  Please advise.

## 2021-11-02 NOTE — Telephone Encounter (Signed)
Patient called back to follow up on previous message I spoke with pre-op nurse and the patients question was answered.

## 2021-11-03 ENCOUNTER — Ambulatory Visit (AMBULATORY_SURGERY_CENTER): Payer: Medicare PPO | Admitting: Gastroenterology

## 2021-11-03 ENCOUNTER — Encounter: Payer: Self-pay | Admitting: Gastroenterology

## 2021-11-03 VITALS — BP 155/67 | HR 65 | Temp 97.5°F | Resp 12 | Ht 66.0 in | Wt 166.0 lb

## 2021-11-03 DIAGNOSIS — D125 Benign neoplasm of sigmoid colon: Secondary | ICD-10-CM | POA: Diagnosis not present

## 2021-11-03 DIAGNOSIS — Z86010 Personal history of colonic polyps: Secondary | ICD-10-CM

## 2021-11-03 DIAGNOSIS — K635 Polyp of colon: Secondary | ICD-10-CM | POA: Diagnosis not present

## 2021-11-03 MED ORDER — SODIUM CHLORIDE 0.9 % IV SOLN: 500.0000 mL | Freq: Once | INTRAVENOUS | Status: DC

## 2021-11-03 NOTE — Patient Instructions (Signed)
Handout on polyps and hemorrhoids provided.  Await pathology results.  Repeat colonoscopy in 5-10 years for surveillance based on pathology results.    YOU HAD AN ENDOSCOPIC PROCEDURE TODAY AT [facility]:   Refer to the procedure report that was given to you for any specific questions about what was found during the examination.  If the procedure report does not answer your questions, please call your gastroenterologist to clarify.  If you requested that your care partner not be given the details of your procedure findings, then the procedure report has been included in a sealed envelope for you to review at your convenience later.  YOU SHOULD EXPECT: Some feelings of bloating in the abdomen. Passage of more gas than usual.  Walking can help get rid of the air that was put into your GI tract during the procedure and reduce the bloating. If you had a lower endoscopy (such as a colonoscopy or flexible sigmoidoscopy) you may notice spotting of blood in your stool or on the toilet paper. If you underwent a bowel prep for your procedure, you may not have a normal bowel movement for a few days.  Please Note:  You might notice some irritation and congestion in your nose or some drainage.  This is from the oxygen used during your procedure.  There is no need for concern and it should clear up in a day or so.  SYMPTOMS TO REPORT IMMEDIATELY:  Following lower endoscopy (colonoscopy or flexible sigmoidoscopy):  Excessive amounts of blood in the stool  Significant tenderness or worsening of abdominal pains  Swelling of the abdomen that is new, acute  Fever of 100F or higher  For urgent or emergent issues, a gastroenterologist can be reached at any hour by calling [phone]. Do not use MyChart messaging for urgent concerns.    DIET:  We do recommend a small meal at first, but then you may proceed to your regular diet.  Drink plenty of fluids but you should avoid alcoholic  beverages for 24 hours.  ACTIVITY:  You should plan to take it easy for the rest of today and you should NOT DRIVE or use heavy machinery until tomorrow (because of the sedation medicines used during the test).    FOLLOW UP: Our staff will call the number listed on your records 48-72 hours following your procedure to check on you and address any questions or concerns that you may have regarding the information given to you following your procedure. If we do not reach you, we will leave a message.  We will attempt to reach you two times.  During this call, we will ask if you have developed any symptoms of COVID 19. If you develop any symptoms (ie: fever, flu-like symptoms, shortness of breath, cough etc.) before then, please call [phone].  If you test positive for Covid 19 in the 2 weeks post procedure, please call and report this information to [place].    If any biopsies were taken you will be contacted by phone or by letter within the next 1-3 weeks.  Please call us at [phone] if you have not heard about the biopsies in 3 weeks.    SIGNATURES/CONFIDENTIALITY: You and/or your care partner have signed paperwork which will be entered into your electronic medical record.  These signatures attest to the fact that that the information above on your After Visit Summary has been reviewed and is understood.  Full responsibility of the confidentiality of this discharge information lies  with you and/or your care-partner.

## 2021-11-03 NOTE — Op Note (Signed)
[place] Patient Name: Jacqueline Flynn Procedure Date: 11/03/2021 2:36 PM MRN: [id number] Endoscopist: Mauri Pole , MD Age: 66 Referring MD:  Date of Birth: 02-28-1956 Gender: Female Account #: [id number] Procedure:                Colonoscopy Indications:              High risk colon cancer surveillance: Personal                            history of sessile serrated colon polyp (less than                            10 mm in size) with no dysplasia Medicines:                Monitored Anesthesia Care Procedure:                Pre-Anesthesia Assessment:                           - Prior to the procedure, a History and Physical                            was performed, and patient medications and                            allergies were reviewed. The patient's tolerance of                            previous anesthesia was also reviewed. The risks                            and benefits of the procedure and the sedation                            options and risks were discussed with the patient.                            All questions were answered, and informed consent                            was obtained. Prior Anticoagulants: The patient has                            taken no previous anticoagulant or antiplatelet                            agents. ASA Grade Assessment: II - A patient with                            mild systemic disease. After reviewing the risks                            and benefits, the patient was deemed in  satisfactory condition to undergo the procedure.                           After obtaining informed consent, the colonoscope                            was passed under direct vision. Throughout the                            procedure, the patient's blood pressure, pulse, and                            oxygen saturations were monitored continuously. The                            [id number]-HQ190L  Colonoscope was introduced through the                            anus and advanced to the the cecum, identified by                            appendiceal orifice and ileocecal valve. The                            colonoscopy was performed without difficulty. The                            patient tolerated the procedure well. The quality                            of the bowel preparation was good. The ileocecal                            valve, appendiceal orifice, and rectum were                            photographed. Scope In: 2:56:02 PM Scope Out: 3:10:24 PM Scope Withdrawal Time: 0 hours 9 minutes 6 seconds  Total Procedure Duration: 0 hours 14 minutes 22 seconds  Findings:                 The perianal and digital rectal examinations were                            normal.                           A 2 mm polyp was found in the sigmoid colon. The                            polyp was sessile. The polyp was removed with a                            cold biopsy forceps. Resection and retrieval were  complete.                           Non-bleeding external and internal hemorrhoids were                            found during retroflexion. The hemorrhoids were                            medium-sized. Complications:            No immediate complications. Estimated Blood Loss:     Estimated blood loss was minimal. Impression:               - One 2 mm polyp in the sigmoid colon, removed with                            a cold biopsy forceps. Resected and retrieved.                           - Non-bleeding external and internal hemorrhoids. Recommendation:           - Patient has a contact number available for                            emergencies. The signs and symptoms of potential                            delayed complications were discussed with the                            patient. Return to normal activities tomorrow.                             Written discharge instructions were provided to the                            patient.                           - Resume previous diet.                           - Continue present medications.                           - Await pathology results.                           - Repeat colonoscopy in 5-10 years for surveillance                            based on pathology results. Mauri Pole, MD 11/03/2021 3:22:30 PM This report has been signed electronically.

## 2021-11-03 NOTE — Progress Notes (Signed)
Called to room to assist during endoscopic procedure.  Patient ID and intended procedure confirmed with present staff. Received instructions for my participation in the procedure from the performing physician.  

## 2021-11-03 NOTE — Progress Notes (Signed)
Report given to PACU, vss 

## 2021-11-03 NOTE — Progress Notes (Signed)
[facility] History and Physical   Primary Care Physician:  Shon Baton, MD   Reason for Procedure:  History of adenomatous colon polyps  Plan:    Surveillance colonoscopy with possible interventions as needed     HPI: Jacqueline Flynn is a very pleasant 66 y.o. female here for surveillance colonoscopy. Denies any nausea, vomiting, abdominal pain, melena or bright red blood per rectum  The risks and benefits as well as alternatives of endoscopic procedure(s) have been discussed and reviewed. All questions answered. The patient agrees to proceed.    Past Medical History:  Diagnosis Date   Anal fistula    HLD (hyperlipidemia)    Osteopenia     Past Surgical History:  Procedure Laterality Date   CESAREAN SECTION     COLONOSCOPY     TREATMENT FISTULA ANAL     repair   TUBAL LIGATION      Prior to Admission medications   Medication Sig Start Date End Date Taking? Authorizing Provider  Black Pepper-Turmeric (TURMERIC CURCUMIN) 02-999 MG CAPS Take by mouth.    [provider]  Fish Oil OIL Take 1,200 mg by mouth daily.    [provider]  Multiple Minerals-Vitamins (CITRACAL PLUS) TABS Take 2 tablets by mouth in the morning and at bedtime.    [provider]  Na Sulfate-K Sulfate-Mg Sulf 17.5-3.13-1.6 GM/177ML SOLN Use per prep instructions 09/13/21   Mauri Pole, MD    Current Outpatient Medications  Medication Sig Dispense Refill   Black Pepper-Turmeric (TURMERIC CURCUMIN) 02-999 MG CAPS Take by mouth.     Fish Oil OIL Take 1,200 mg by mouth daily.     Multiple Minerals-Vitamins (CITRACAL PLUS) TABS Take 2 tablets by mouth in the morning and at bedtime.     Na Sulfate-K Sulfate-Mg Sulf 17.5-3.13-1.6 GM/177ML SOLN Use per prep instructions 354 mL 0   No current facility-administered medications for this visit.    Allergies as of 11/03/2021 - Review Complete 09/26/2021  Allergen Reaction Noted   Azithromycin   03/11/2021    Family History  Problem Relation Age of Onset   Alzheimer's disease Mother    Hypercholesterolemia Mother    Leukemia Father    Alzheimer's disease Father    Hypertension Father    Dementia Maternal Grandmother    Heart attack Maternal Grandfather    Heart attack Paternal Grandfather    Colon cancer Neg Hx     Social History   Socioeconomic History   Marital status: Married    Spouse name: Not on file   Number of children: 3   Years of education: Not on file   Highest education level: Not on file  Occupational History   Occupation: retired  Tobacco Use   Smoking status: Never   Smokeless tobacco: Never  Vaping Use   Vaping Use: Never used  Substance and Sexual Activity   Alcohol use: Yes    Alcohol/week: 7.0 standard drinks    Types: 7 Glasses of wine per week    Comment: 1 per day   Drug use: No   Sexual activity: Not on file  Other Topics Concern   Not on file  Social History Narrative   Not on file   Social Determinants of Health   Financial Resource Strain: Not on file  Food Insecurity: Not on file  Transportation Needs: Not on file  Physical Activity: Not on file  Stress: Not on file  Social Connections: Not on file  Intimate Partner Violence:  Not on file    Review of Systems:  All other review of systems negative except as mentioned in the HPI.  Physical Exam: Vital signs in last 24 hours: BP (!) 171/81    Pulse 70    Temp (!) 97.5 F (36.4 C)    Resp 11    Ht 5\' 6"  (1.676 m)    Wt 166 lb (75.3 kg)    SpO2 100%    BMI 26.79 kg/m  General:   Alert, NAD Lungs:  Clear .   Heart:  Regular rate and rhythm Abdomen:  Soft, nontender and nondistended. Neuro/Psych:  Alert and cooperative. Normal mood and affect. A and O x 3  Reviewed labs, radiology imaging, old records and pertinent past GI work up  Patient is appropriate for planned procedure(s) and anesthesia in an ambulatory setting   K. Denzil Magnuson , MD [phone]

## 2021-11-04 ENCOUNTER — Telehealth: Payer: Self-pay

## 2021-11-04 NOTE — Telephone Encounter (Signed)
°  Follow up Call-  Call back number 11/03/2021  Post procedure Call Back phone  # [phone]  Permission to leave phone message Yes  Some recent data might be hidden     Patient questions:  Do you have a fever, pain , or abdominal swelling? No. Pain Score  0 *  Have you tolerated food without any problems? Yes.    Have you been able to return to your normal activities? Yes.    Do you have any questions about your discharge instructions: Diet   No. Medications  No. Follow up visit  No.  Do you have questions or concerns about your Care? No.  Actions: * If pain score is 4 or above: No action needed, pain <4.

## 2021-11-23 ENCOUNTER — Encounter: Payer: Self-pay | Admitting: Gastroenterology

## 2021-11-26 ENCOUNTER — Telehealth: Payer: Self-pay | Admitting: Gastroenterology

## 2021-11-26 NOTE — Telephone Encounter (Signed)
Inbound call from patient requesting results from Colonoscopy  done on 1/25. Please advise.

## 2021-11-26 NOTE — Telephone Encounter (Signed)
Letter mailed 11/23/21. Read letter to the patient.

## 2021-12-11 DIAGNOSIS — M1712 Unilateral primary osteoarthritis, left knee: Secondary | ICD-10-CM | POA: Diagnosis not present

## 2021-12-11 DIAGNOSIS — M25562 Pain in left knee: Secondary | ICD-10-CM | POA: Diagnosis not present

## 2021-12-14 ENCOUNTER — Ambulatory Visit: Payer: Medicare PPO | Admitting: Sports Medicine

## 2022-01-04 DIAGNOSIS — M25562 Pain in left knee: Secondary | ICD-10-CM | POA: Diagnosis not present

## 2022-02-24 DIAGNOSIS — M898X9 Other specified disorders of bone, unspecified site: Secondary | ICD-10-CM | POA: Diagnosis not present

## 2022-03-15 DIAGNOSIS — M8580 Other specified disorders of bone density and structure, unspecified site: Secondary | ICD-10-CM | POA: Diagnosis not present

## 2022-03-15 DIAGNOSIS — E785 Hyperlipidemia, unspecified: Secondary | ICD-10-CM | POA: Diagnosis not present

## 2022-03-15 DIAGNOSIS — R946 Abnormal results of thyroid function studies: Secondary | ICD-10-CM | POA: Diagnosis not present

## 2022-03-15 DIAGNOSIS — R7989 Other specified abnormal findings of blood chemistry: Secondary | ICD-10-CM | POA: Diagnosis not present

## 2022-03-18 DIAGNOSIS — Z0000 Encounter for general adult medical examination without abnormal findings: Secondary | ICD-10-CM | POA: Diagnosis not present

## 2022-03-22 DIAGNOSIS — F4321 Adjustment disorder with depressed mood: Secondary | ICD-10-CM | POA: Diagnosis not present

## 2022-03-22 DIAGNOSIS — R030 Elevated blood-pressure reading, without diagnosis of hypertension: Secondary | ICD-10-CM | POA: Diagnosis not present

## 2022-03-22 DIAGNOSIS — D239 Other benign neoplasm of skin, unspecified: Secondary | ICD-10-CM | POA: Diagnosis not present

## 2022-03-22 DIAGNOSIS — M898X9 Other specified disorders of bone, unspecified site: Secondary | ICD-10-CM | POA: Diagnosis not present

## 2022-03-22 DIAGNOSIS — E785 Hyperlipidemia, unspecified: Secondary | ICD-10-CM | POA: Diagnosis not present

## 2022-03-22 DIAGNOSIS — R946 Abnormal results of thyroid function studies: Secondary | ICD-10-CM | POA: Diagnosis not present

## 2022-03-22 DIAGNOSIS — M8580 Other specified disorders of bone density and structure, unspecified site: Secondary | ICD-10-CM | POA: Diagnosis not present

## 2022-03-22 DIAGNOSIS — M1990 Unspecified osteoarthritis, unspecified site: Secondary | ICD-10-CM | POA: Diagnosis not present

## 2022-03-22 DIAGNOSIS — Z0000 Encounter for general adult medical examination without abnormal findings: Secondary | ICD-10-CM | POA: Diagnosis not present

## 2022-03-22 DIAGNOSIS — R82998 Other abnormal findings in urine: Secondary | ICD-10-CM | POA: Diagnosis not present

## 2022-03-24 DIAGNOSIS — L814 Other melanin hyperpigmentation: Secondary | ICD-10-CM | POA: Diagnosis not present

## 2022-03-24 DIAGNOSIS — L821 Other seborrheic keratosis: Secondary | ICD-10-CM | POA: Diagnosis not present

## 2022-03-24 DIAGNOSIS — I788 Other diseases of capillaries: Secondary | ICD-10-CM | POA: Diagnosis not present

## 2022-09-27 DIAGNOSIS — H43813 Vitreous degeneration, bilateral: Secondary | ICD-10-CM | POA: Diagnosis not present

## 2022-09-27 DIAGNOSIS — H2513 Age-related nuclear cataract, bilateral: Secondary | ICD-10-CM | POA: Diagnosis not present

## 2022-09-27 DIAGNOSIS — H52223 Regular astigmatism, bilateral: Secondary | ICD-10-CM | POA: Diagnosis not present

## 2022-09-27 DIAGNOSIS — H5213 Myopia, bilateral: Secondary | ICD-10-CM | POA: Diagnosis not present

## 2022-09-27 DIAGNOSIS — H524 Presbyopia: Secondary | ICD-10-CM | POA: Diagnosis not present

## 2022-10-20 DIAGNOSIS — L814 Other melanin hyperpigmentation: Secondary | ICD-10-CM | POA: Diagnosis not present

## 2022-10-20 DIAGNOSIS — D225 Melanocytic nevi of trunk: Secondary | ICD-10-CM | POA: Diagnosis not present

## 2022-10-20 DIAGNOSIS — D2271 Melanocytic nevi of right lower limb, including hip: Secondary | ICD-10-CM | POA: Diagnosis not present

## 2022-10-20 DIAGNOSIS — D2261 Melanocytic nevi of right upper limb, including shoulder: Secondary | ICD-10-CM | POA: Diagnosis not present

## 2022-10-20 DIAGNOSIS — D2262 Melanocytic nevi of left upper limb, including shoulder: Secondary | ICD-10-CM | POA: Diagnosis not present

## 2022-10-20 DIAGNOSIS — D2272 Melanocytic nevi of left lower limb, including hip: Secondary | ICD-10-CM | POA: Diagnosis not present

## 2022-10-20 DIAGNOSIS — L738 Other specified follicular disorders: Secondary | ICD-10-CM | POA: Diagnosis not present

## 2022-10-20 DIAGNOSIS — L570 Actinic keratosis: Secondary | ICD-10-CM | POA: Diagnosis not present

## 2022-10-20 DIAGNOSIS — L821 Other seborrheic keratosis: Secondary | ICD-10-CM | POA: Diagnosis not present

## 2023-03-30 DIAGNOSIS — R946 Abnormal results of thyroid function studies: Secondary | ICD-10-CM | POA: Diagnosis not present

## 2023-03-30 DIAGNOSIS — E785 Hyperlipidemia, unspecified: Secondary | ICD-10-CM | POA: Diagnosis not present

## 2023-03-30 DIAGNOSIS — R030 Elevated blood-pressure reading, without diagnosis of hypertension: Secondary | ICD-10-CM | POA: Diagnosis not present

## 2023-03-30 DIAGNOSIS — R7989 Other specified abnormal findings of blood chemistry: Secondary | ICD-10-CM | POA: Diagnosis not present

## 2023-03-30 DIAGNOSIS — M8580 Other specified disorders of bone density and structure, unspecified site: Secondary | ICD-10-CM | POA: Diagnosis not present

## 2023-04-06 DIAGNOSIS — M8580 Other specified disorders of bone density and structure, unspecified site: Secondary | ICD-10-CM | POA: Diagnosis not present

## 2023-04-06 DIAGNOSIS — R030 Elevated blood-pressure reading, without diagnosis of hypertension: Secondary | ICD-10-CM | POA: Diagnosis not present

## 2023-04-06 DIAGNOSIS — Z0000 Encounter for general adult medical examination without abnormal findings: Secondary | ICD-10-CM | POA: Diagnosis not present

## 2023-04-06 DIAGNOSIS — R946 Abnormal results of thyroid function studies: Secondary | ICD-10-CM | POA: Diagnosis not present

## 2023-04-06 DIAGNOSIS — E785 Hyperlipidemia, unspecified: Secondary | ICD-10-CM | POA: Diagnosis not present

## 2023-04-06 DIAGNOSIS — F4321 Adjustment disorder with depressed mood: Secondary | ICD-10-CM | POA: Diagnosis not present

## 2023-04-06 DIAGNOSIS — R82998 Other abnormal findings in urine: Secondary | ICD-10-CM | POA: Diagnosis not present

## 2023-04-06 DIAGNOSIS — M542 Cervicalgia: Secondary | ICD-10-CM | POA: Diagnosis not present

## 2023-04-06 DIAGNOSIS — M1990 Unspecified osteoarthritis, unspecified site: Secondary | ICD-10-CM | POA: Diagnosis not present

## 2023-04-06 DIAGNOSIS — G43009 Migraine without aura, not intractable, without status migrainosus: Secondary | ICD-10-CM | POA: Diagnosis not present

## 2023-04-11 ENCOUNTER — Other Ambulatory Visit: Payer: Self-pay | Admitting: Internal Medicine

## 2023-04-11 DIAGNOSIS — E785 Hyperlipidemia, unspecified: Secondary | ICD-10-CM

## 2023-04-11 DIAGNOSIS — M898X9 Other specified disorders of bone, unspecified site: Secondary | ICD-10-CM

## 2023-05-04 ENCOUNTER — Ambulatory Visit
Admission: RE | Admit: 2023-05-04 | Discharge: 2023-05-04 | Disposition: A | Discharge status: Home / Self Care | Payer: No Typology Code available for payment source | Source: Ambulatory Visit | Attending: Internal Medicine | Admitting: Internal Medicine

## 2023-05-04 ENCOUNTER — Ambulatory Visit
Admission: RE | Admit: 2023-05-04 | Discharge: 2023-05-04 | Disposition: A | Discharge status: Home / Self Care | Payer: Medicare PPO | Source: Ambulatory Visit | Attending: Internal Medicine | Admitting: Internal Medicine

## 2023-05-04 DIAGNOSIS — M898X9 Other specified disorders of bone, unspecified site: Secondary | ICD-10-CM

## 2023-05-04 DIAGNOSIS — E785 Hyperlipidemia, unspecified: Secondary | ICD-10-CM

## 2023-05-04 DIAGNOSIS — H9202 Otalgia, left ear: Secondary | ICD-10-CM | POA: Diagnosis not present

## 2023-05-04 DIAGNOSIS — R220 Localized swelling, mass and lump, head: Secondary | ICD-10-CM | POA: Diagnosis not present

## 2023-06-07 DIAGNOSIS — L089 Local infection of the skin and subcutaneous tissue, unspecified: Secondary | ICD-10-CM | POA: Diagnosis not present

## 2023-06-07 DIAGNOSIS — L237 Allergic contact dermatitis due to plants, except food: Secondary | ICD-10-CM | POA: Diagnosis not present

## 2023-06-16 DIAGNOSIS — E785 Hyperlipidemia, unspecified: Secondary | ICD-10-CM | POA: Diagnosis not present

## 2023-06-26 DIAGNOSIS — Z124 Encounter for screening for malignant neoplasm of cervix: Secondary | ICD-10-CM | POA: Diagnosis not present

## 2023-06-26 DIAGNOSIS — M8580 Other specified disorders of bone density and structure, unspecified site: Secondary | ICD-10-CM | POA: Diagnosis not present

## 2023-06-26 DIAGNOSIS — Z1231 Encounter for screening mammogram for malignant neoplasm of breast: Secondary | ICD-10-CM | POA: Diagnosis not present

## 2023-06-26 DIAGNOSIS — Z1151 Encounter for screening for human papillomavirus (HPV): Secondary | ICD-10-CM | POA: Diagnosis not present

## 2023-06-26 DIAGNOSIS — Z6825 Body mass index (BMI) 25.0-25.9, adult: Secondary | ICD-10-CM | POA: Diagnosis not present

## 2023-06-30 DIAGNOSIS — N390 Urinary tract infection, site not specified: Secondary | ICD-10-CM | POA: Diagnosis not present

## 2023-08-17 DIAGNOSIS — M79641 Pain in right hand: Secondary | ICD-10-CM | POA: Diagnosis not present

## 2023-08-17 DIAGNOSIS — M778 Other enthesopathies, not elsewhere classified: Secondary | ICD-10-CM | POA: Diagnosis not present

## 2023-09-29 DIAGNOSIS — M778 Other enthesopathies, not elsewhere classified: Secondary | ICD-10-CM | POA: Diagnosis not present

## 2023-09-29 DIAGNOSIS — M1811 Unilateral primary osteoarthritis of first carpometacarpal joint, right hand: Secondary | ICD-10-CM | POA: Diagnosis not present

## 2023-09-29 DIAGNOSIS — M654 Radial styloid tenosynovitis [de Quervain]: Secondary | ICD-10-CM | POA: Diagnosis not present

## 2023-10-03 DIAGNOSIS — M79641 Pain in right hand: Secondary | ICD-10-CM | POA: Diagnosis not present

## 2023-10-03 DIAGNOSIS — M25531 Pain in right wrist: Secondary | ICD-10-CM | POA: Diagnosis not present

## 2023-10-10 DIAGNOSIS — H43813 Vitreous degeneration, bilateral: Secondary | ICD-10-CM | POA: Diagnosis not present

## 2023-10-10 DIAGNOSIS — H52223 Regular astigmatism, bilateral: Secondary | ICD-10-CM | POA: Diagnosis not present

## 2023-10-10 DIAGNOSIS — H5213 Myopia, bilateral: Secondary | ICD-10-CM | POA: Diagnosis not present

## 2023-10-10 DIAGNOSIS — H524 Presbyopia: Secondary | ICD-10-CM | POA: Diagnosis not present

## 2023-10-10 DIAGNOSIS — H2513 Age-related nuclear cataract, bilateral: Secondary | ICD-10-CM | POA: Diagnosis not present

## 2023-12-11 DIAGNOSIS — D2272 Melanocytic nevi of left lower limb, including hip: Secondary | ICD-10-CM | POA: Diagnosis not present

## 2023-12-11 DIAGNOSIS — D2271 Melanocytic nevi of right lower limb, including hip: Secondary | ICD-10-CM | POA: Diagnosis not present

## 2023-12-11 DIAGNOSIS — D2262 Melanocytic nevi of left upper limb, including shoulder: Secondary | ICD-10-CM | POA: Diagnosis not present

## 2023-12-11 DIAGNOSIS — D224 Melanocytic nevi of scalp and neck: Secondary | ICD-10-CM | POA: Diagnosis not present

## 2023-12-11 DIAGNOSIS — L821 Other seborrheic keratosis: Secondary | ICD-10-CM | POA: Diagnosis not present

## 2023-12-11 DIAGNOSIS — D225 Melanocytic nevi of trunk: Secondary | ICD-10-CM | POA: Diagnosis not present

## 2023-12-11 DIAGNOSIS — L814 Other melanin hyperpigmentation: Secondary | ICD-10-CM | POA: Diagnosis not present

## 2024-02-15 DIAGNOSIS — L2989 Other pruritus: Secondary | ICD-10-CM | POA: Diagnosis not present

## 2024-03-07 DIAGNOSIS — N958 Other specified menopausal and perimenopausal disorders: Secondary | ICD-10-CM | POA: Diagnosis not present

## 2024-03-07 DIAGNOSIS — M8588 Other specified disorders of bone density and structure, other site: Secondary | ICD-10-CM | POA: Diagnosis not present

## 2024-04-04 DIAGNOSIS — Z1212 Encounter for screening for malignant neoplasm of rectum: Secondary | ICD-10-CM | POA: Diagnosis not present

## 2024-04-04 DIAGNOSIS — E785 Hyperlipidemia, unspecified: Secondary | ICD-10-CM | POA: Diagnosis not present

## 2024-04-04 DIAGNOSIS — E7849 Other hyperlipidemia: Secondary | ICD-10-CM | POA: Diagnosis not present

## 2024-04-04 DIAGNOSIS — R946 Abnormal results of thyroid function studies: Secondary | ICD-10-CM | POA: Diagnosis not present

## 2024-04-04 DIAGNOSIS — M8580 Other specified disorders of bone density and structure, unspecified site: Secondary | ICD-10-CM | POA: Diagnosis not present

## 2024-04-04 DIAGNOSIS — Z79899 Other long term (current) drug therapy: Secondary | ICD-10-CM | POA: Diagnosis not present

## 2024-04-11 DIAGNOSIS — R931 Abnormal findings on diagnostic imaging of heart and coronary circulation: Secondary | ICD-10-CM | POA: Diagnosis not present

## 2024-04-11 DIAGNOSIS — M8580 Other specified disorders of bone density and structure, unspecified site: Secondary | ICD-10-CM | POA: Diagnosis not present

## 2024-04-11 DIAGNOSIS — Z0000 Encounter for general adult medical examination without abnormal findings: Secondary | ICD-10-CM | POA: Diagnosis not present

## 2024-04-11 DIAGNOSIS — R946 Abnormal results of thyroid function studies: Secondary | ICD-10-CM | POA: Diagnosis not present

## 2024-04-11 DIAGNOSIS — M542 Cervicalgia: Secondary | ICD-10-CM | POA: Diagnosis not present

## 2024-04-11 DIAGNOSIS — Z1331 Encounter for screening for depression: Secondary | ICD-10-CM | POA: Diagnosis not present

## 2024-04-11 DIAGNOSIS — E785 Hyperlipidemia, unspecified: Secondary | ICD-10-CM | POA: Diagnosis not present

## 2024-04-11 DIAGNOSIS — I700 Atherosclerosis of aorta: Secondary | ICD-10-CM | POA: Diagnosis not present

## 2024-04-11 DIAGNOSIS — Z1389 Encounter for screening for other disorder: Secondary | ICD-10-CM | POA: Diagnosis not present

## 2024-04-11 DIAGNOSIS — R030 Elevated blood-pressure reading, without diagnosis of hypertension: Secondary | ICD-10-CM | POA: Diagnosis not present

## 2024-04-11 DIAGNOSIS — R82998 Other abnormal findings in urine: Secondary | ICD-10-CM | POA: Diagnosis not present

## 2024-05-06 DIAGNOSIS — M79671 Pain in right foot: Secondary | ICD-10-CM | POA: Diagnosis not present

## 2024-06-27 DIAGNOSIS — Z01419 Encounter for gynecological examination (general) (routine) without abnormal findings: Secondary | ICD-10-CM | POA: Diagnosis not present

## 2024-06-27 DIAGNOSIS — Z6825 Body mass index (BMI) 25.0-25.9, adult: Secondary | ICD-10-CM | POA: Diagnosis not present

## 2024-06-27 DIAGNOSIS — M8580 Other specified disorders of bone density and structure, unspecified site: Secondary | ICD-10-CM | POA: Diagnosis not present

## 2024-06-27 DIAGNOSIS — Z1231 Encounter for screening mammogram for malignant neoplasm of breast: Secondary | ICD-10-CM | POA: Diagnosis not present

## 2024-11-06 ENCOUNTER — Encounter: Payer: Self-pay | Admitting: Gastroenterology

## 2024-11-06 ENCOUNTER — Ambulatory Visit: Admitting: Gastroenterology

## 2024-11-06 VITALS — BP 132/60 | HR 73 | Ht 66.0 in | Wt 164.2 lb

## 2024-11-06 DIAGNOSIS — K648 Other hemorrhoids: Secondary | ICD-10-CM | POA: Insufficient documentation

## 2024-11-06 MED ORDER — HYDROCORTISONE (PERIANAL) 2.5 % EX CREA: 1.0000 | TOPICAL_CREAM | Freq: Two times a day (BID) | CUTANEOUS | 1 refills | Status: AC

## 2024-11-06 NOTE — Progress Notes (Signed)
 "    11/06/2024 KYANI SIMKIN [id number] 1955-11-29   Discussed the use of AI scribe software for clinical note transcription with the patient, who gave verbal consent to proceed.  History of Present Illness ROSINA CRESSLER is a 69 year old female with internal and external hemorrhoids who presents for evaluation of recurrent hemorrhoid symptoms.  She is a patient of Dr. Trenna.  Intermittent flares of hemorrhoid symptoms have occurred over the past few months, with the most recent episode prompting this appointment over a month ago. Initial improvement was noted topical treatments, including Preparation H and Tucks pads, but symptoms have recently recurred. Current complaints include soreness and mild burning in the anorectal area, primarily internal and sometimes prolapsing. No rectal bleeding or severe pain is reported.   Colonoscopy in January 2023 revealed a very small polyp (removed) and non-bleeding internal and external hemorrhoids. Hydrocortisone  cream has not been used for symptom management.  Bowel habits are regular, with at least one bowel movement daily, sometimes two, without straining or hard stools. Stools are solid but not in large pieces. Daily flaxseed is used. No abdominal pain is present. Occasional gas is reported, without other gastrointestinal complaints.  Perianal hygiene includes use of baby wipes to avoid irritation from toilet paper and avoidance of aggressive cleaning.   Colonoscopy 10/2021: - One 2 mm polyp in the sigmoid colon, removed with a cold biopsy forceps. Resected and retrieved. - Non- bleeding external and internal hemorrhoids.  Hyperplastic polyp.   Past Medical History:  Diagnosis Date   Anal fistula    Colon polyps    HLD (hyperlipidemia)    Osteopenia    UTI (urinary tract infection)    Past Surgical History:  Procedure Laterality Date   CESAREAN SECTION     COLONOSCOPY     TREATMENT FISTULA ANAL     repair   TUBAL  LIGATION      reports that she has never smoked. She has never used smokeless tobacco. She reports current alcohol  use of about 7.0 standard drinks of alcohol  per week. She reports that she does not use drugs. family history includes Alzheimer's disease in her father and mother; Dementia in her maternal grandmother; Heart attack in her maternal grandfather and paternal grandfather; Hypercholesterolemia in her mother; Hypertension in her father; Leukemia in her father. Allergies[1]    Outpatient Encounter Medications as of 11/06/2024  Medication Sig   Black Pepper-Turmeric (TURMERIC CURCUMIN) 02-999 MG CAPS Take by mouth.   Fish Oil OIL Take 1,200 mg by mouth daily.   Multiple Minerals-Vitamins (CITRACAL PLUS) TABS Take 2 tablets by mouth in the morning and at bedtime.   nitrofurantoin, macrocrystal-monohydrate, (MACROBID) 100 MG capsule 1 capsule with food   rosuvastatin (CRESTOR) 10 MG tablet Take 10 mg by mouth daily.   No facility-administered encounter medications on file as of 11/06/2024.    REVIEW OF SYSTEMS  : All other systems reviewed and negative except where noted in the History of Present Illness.   PHYSICAL EXAM: BP 132/60   Pulse 73   Ht 5' 6 (1.676 m)   Wt 164 lb 4 oz (74.5 kg)   BMI 26.51 kg/m  General: Well developed white female in no acute distress Head: Normocephalic and atraumatic Eyes:  Sclerae anicteric, conjunctiva pink. Ears: Normal auditory acuity Rectal: Small posterior perianal tear.  This looks like a little area of excoriation with minimal bleeding on contact.  No external hemorrhoids noted.  DRE did not reveal any masses. Musculoskeletal: Symmetrical  with no gross deformities  Skin: No lesions on visible extremities Extremities: No edema  Neurological: Alert oriented x 4, grossly non-focal Psychological:  Alert and cooperative. Normal mood and affect Assessment & Plan Internal hemorrhoids She has experienced intermittent symptoms over several  months with recent exacerbation. Topical therapy is expected to reduce inflammation. Rubber band ligation remains an option if symptoms persist or recur frequently. - Prescribed hydrocortisone  cream to be applied to Preparation H suppositories and inserted rectally at bedtime and optionally after morning bowel movements for five days. - Provided educational materials regarding internal hemorrhoid banding as a future option if symptoms persist or recur frequently. - Recommended maintaining soft stools and avoiding straining during defecation.  Anal skin tear A minor perianal skin tear is present, likely contributing to localized burning and irritation. Healing is anticipated with topical protection and gentle hygiene. - Recommended gentle wiping and avoidance of aggressive cleaning to prevent further irritation. - Advised application of Desitin (zinc oxide cream) to the affected area at bedtime to promote healing and protect the skin. - Discussed that the burning sensation may be related to this localized skin tear.   CC:  Onita Rush, MD       [1]  Allergies Allergen Reactions   Azithromycin     Other reaction(s): itchy and rash   "

## 2024-11-06 NOTE — Patient Instructions (Signed)
 We have sent the following medications to your pharmacy for you to pick up at your convenience: Hydrocortisone  2.5 % cream - Cover Preparation H suppository two times daily for 5 days.  Call if symptoms persist and want to proceed with Hemorrhoid banding.   Can use [place] externally at bedtime.   _______________________________________________________  If your blood pressure at your visit was 140/90 or greater, please contact your primary care physician to follow up on this.  _______________________________________________________  If you are age 69 or older, your body mass index should be between 23-30. Your Body mass index is 26.51 kg/m. If this is out of the aforementioned range listed, please consider follow up with your Primary Care Provider.  If you are age 69 or younger, your body mass index should be between 19-25. Your Body mass index is 26.51 kg/m. If this is out of the aformentioned range listed, please consider follow up with your Primary Care Provider.   ________________________________________________________  The [facility] providers would like to encourage you to use MYCHART to communicate with providers for non-urgent requests or questions.  Due to long hold times on the telephone, sending your provider a message by [facility] may be a faster and more efficient way to get a response.  Please allow 48 business hours for a response.  Please remember that this is for non-urgent requests.  _______________________________________________________  [facility] is using a team-based approach to care.  Your team is made up of your doctor and two to three APPS. Our APPS (Nurse Practitioners and Physician Assistants) work with your physician to ensure care continuity for you. They are fully qualified to address your health concerns and develop a treatment plan. They communicate directly with your gastroenterologist to care for you. Seeing the Advanced Practice Practitioners on  your physician's team can help you by facilitating care more promptly, often allowing for earlier appointments, access to diagnostic testing, procedures, and other specialty referrals.
